# Patient Record
Sex: Male | Born: 1985 | Race: White | Hispanic: No | Marital: Single | Smoking: Never smoker
Health system: Southern US, Community
[De-identification: ages and names within clinical notes are randomized; demographics above are authoritative.]

## PROBLEM LIST (undated history)

## (undated) DIAGNOSIS — N200 Calculus of kidney: Secondary | ICD-10-CM

## (undated) DIAGNOSIS — E669 Obesity, unspecified: Secondary | ICD-10-CM

## (undated) DIAGNOSIS — J45909 Unspecified asthma, uncomplicated: Secondary | ICD-10-CM

## (undated) HISTORY — DX: Obesity, unspecified: E66.9

## (undated) HISTORY — DX: Unspecified asthma, uncomplicated: J45.909

## (undated) HISTORY — PX: LITHOTRIPSY: SUR834

## (undated) HISTORY — DX: Calculus of kidney: N20.0

---

## 2004-10-18 ENCOUNTER — Emergency Department: Payer: Self-pay | Admitting: Unknown Physician Specialty

## 2006-01-21 ENCOUNTER — Emergency Department: Payer: Self-pay | Admitting: Emergency Medicine

## 2006-02-26 ENCOUNTER — Emergency Department: Payer: Self-pay | Admitting: Emergency Medicine

## 2006-09-20 ENCOUNTER — Emergency Department: Payer: Self-pay | Admitting: Emergency Medicine

## 2007-11-16 ENCOUNTER — Emergency Department: Payer: Self-pay | Admitting: Emergency Medicine

## 2007-12-18 ENCOUNTER — Ambulatory Visit: Payer: Self-pay | Admitting: Specialist

## 2008-02-29 ENCOUNTER — Ambulatory Visit: Payer: Self-pay | Admitting: Specialist

## 2008-03-25 ENCOUNTER — Ambulatory Visit: Payer: Self-pay | Admitting: Specialist

## 2008-04-02 ENCOUNTER — Ambulatory Visit: Payer: Self-pay | Admitting: Specialist

## 2008-04-03 ENCOUNTER — Ambulatory Visit: Payer: Self-pay | Admitting: Specialist

## 2008-04-12 ENCOUNTER — Ambulatory Visit: Payer: Self-pay | Admitting: Specialist

## 2008-12-02 ENCOUNTER — Emergency Department: Payer: Self-pay | Admitting: Emergency Medicine

## 2010-05-10 ENCOUNTER — Emergency Department: Payer: Self-pay | Admitting: Emergency Medicine

## 2010-06-26 ENCOUNTER — Emergency Department: Payer: Self-pay | Admitting: Emergency Medicine

## 2011-03-20 ENCOUNTER — Emergency Department: Payer: Self-pay | Admitting: *Deleted

## 2011-03-20 LAB — URINALYSIS, COMPLETE
Bilirubin,UR: NEGATIVE
Blood: NEGATIVE
Ketone: NEGATIVE
Nitrite: NEGATIVE
Ph: 8 (ref 4.5–8.0)
Protein: 30
RBC,UR: 7 /HPF (ref 0–5)
Specific Gravity: 1.025 (ref 1.003–1.030)
Squamous Epithelial: NONE SEEN

## 2012-07-23 IMAGING — CT CT STONE STUDY
1 of 2 series · 15 of 32 positions shown, 19 images · non-contrast
Comparison: None

REASON FOR EXAM: left flank pain (hx kidney stones)
COMMENTS:

PROCEDURE:     CT  - CT ABDOMEN /PELVIS WO (STONE)  - June 26, 2010  [DATE]
RESULT:     Indication: Left flank pain
TECHNIQUE: Multiple axial images from the lung bases to the symphysis pubis
were obtained without oral and without intravenous contrast.

[Series 2: stone · axial · 0.73mm/px · z∈[+186,+634]mm · 15 of 163 slices shown, 19 images]
[im 7/163  soft-tissue]
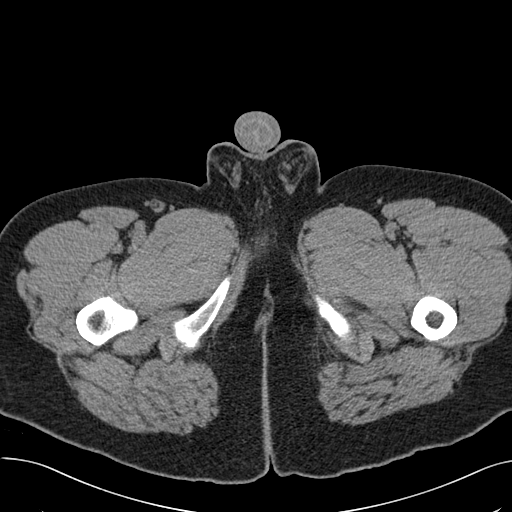
[im 7/163  bone]
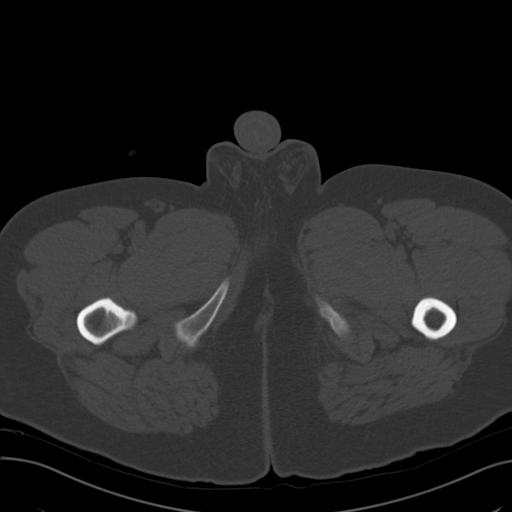
[im 20/163  soft-tissue]
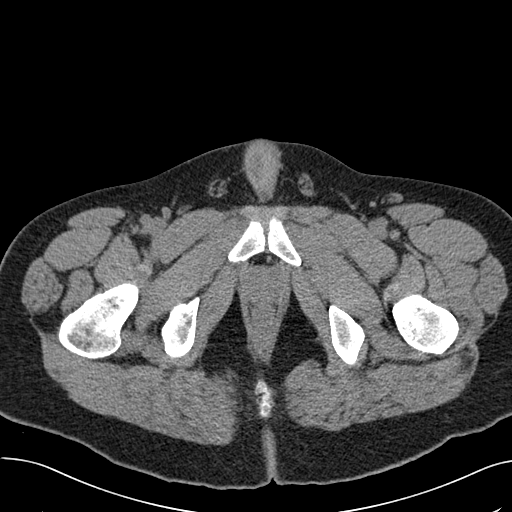
[im 33/163  soft-tissue]
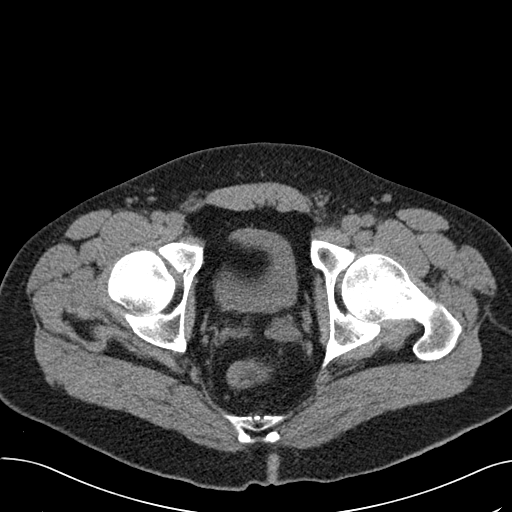
[im 46/163  soft-tissue]
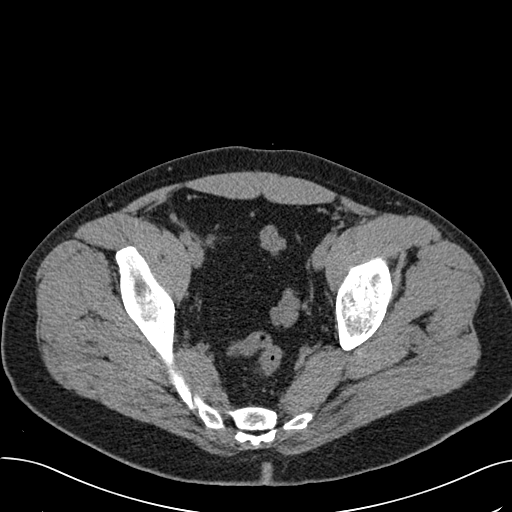
[im 59/163  soft-tissue]
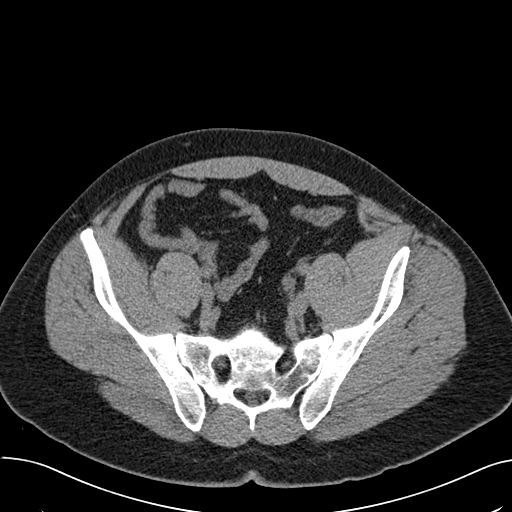
[im 72/163  soft-tissue]
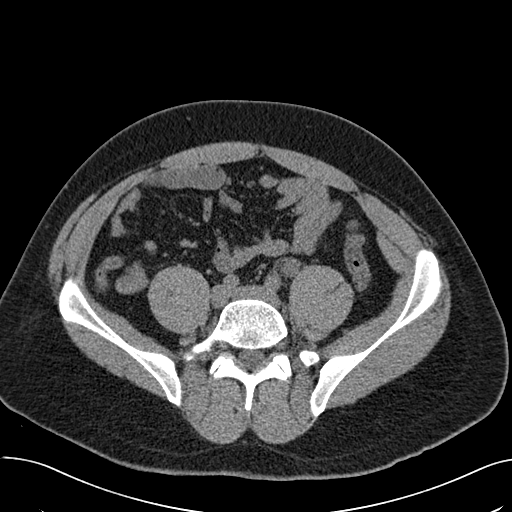
[im 85/163  soft-tissue]
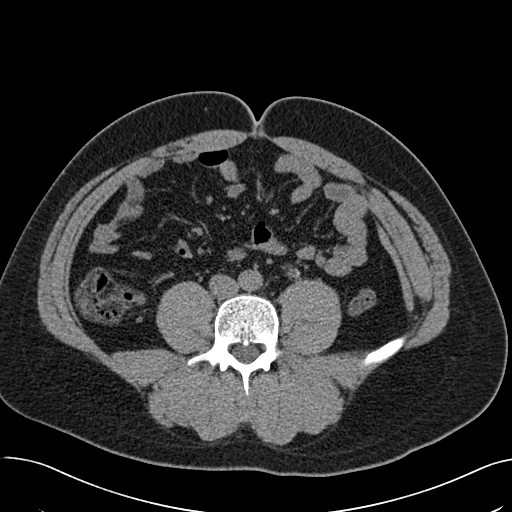
[im 91/163  soft-tissue]
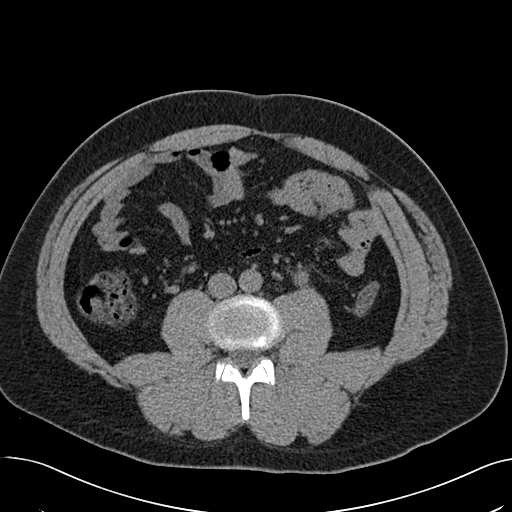
[im 104/163  soft-tissue]
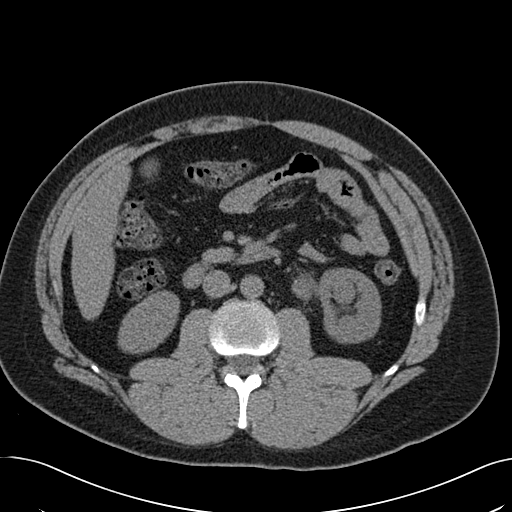
[im 104/163  bone]
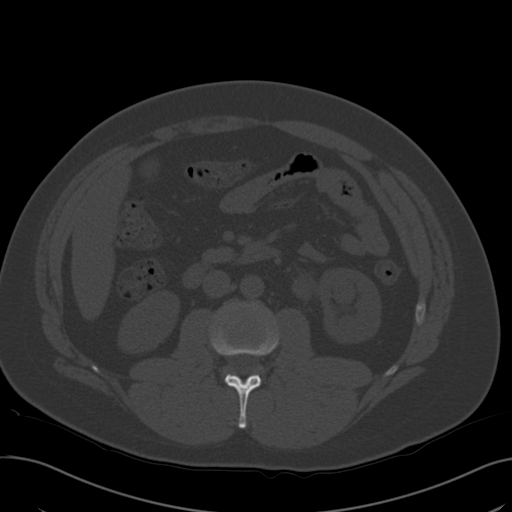
[im 117/163  soft-tissue]
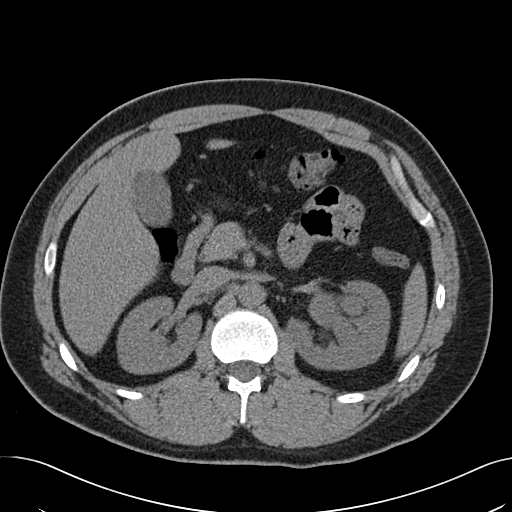
[im 130/163  soft-tissue]
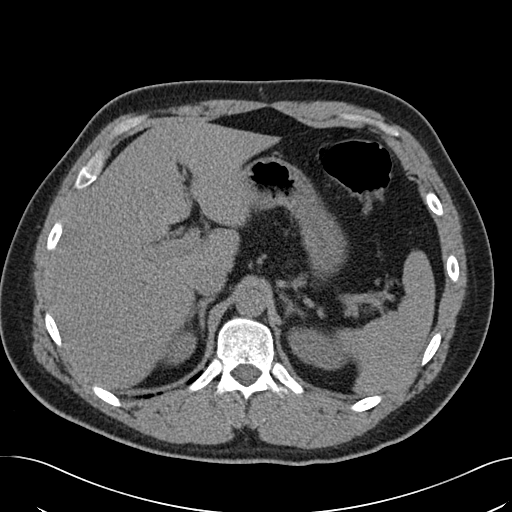
[im 137/163  lung]
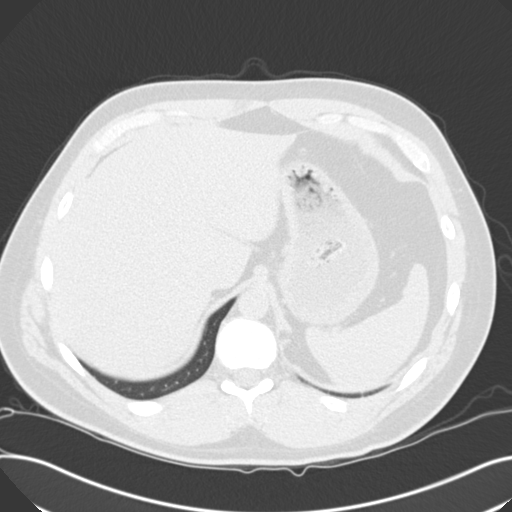
[im 143/163  soft-tissue]
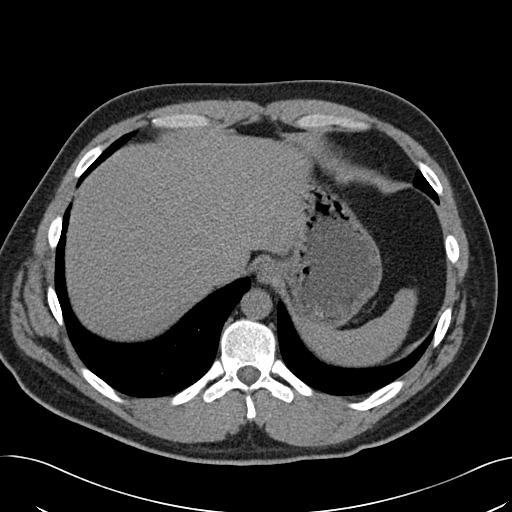
[im 143/163  lung]
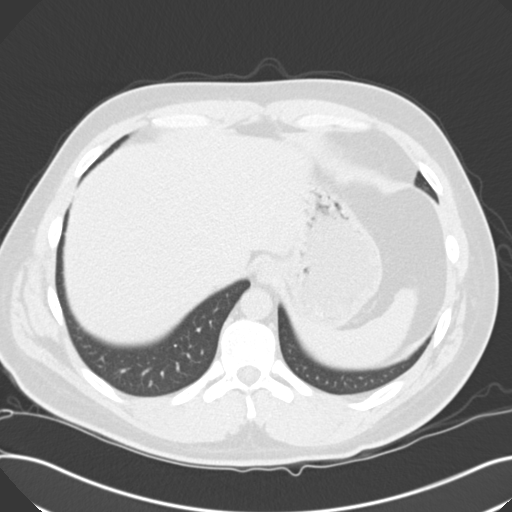
[im 150/163  lung]
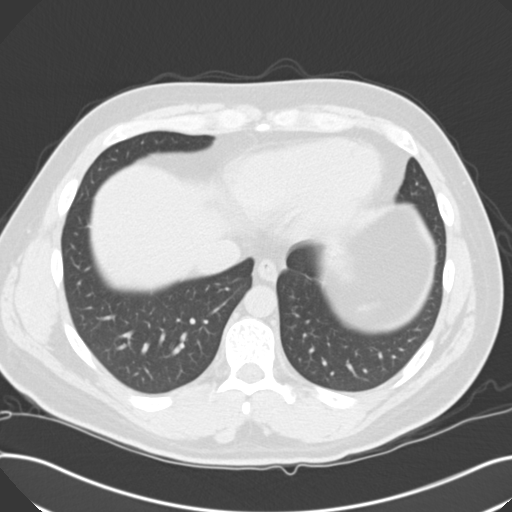
[im 156/163  soft-tissue]
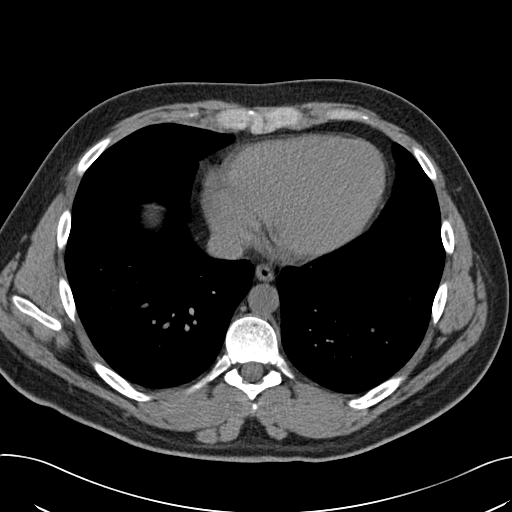
[im 156/163  lung]
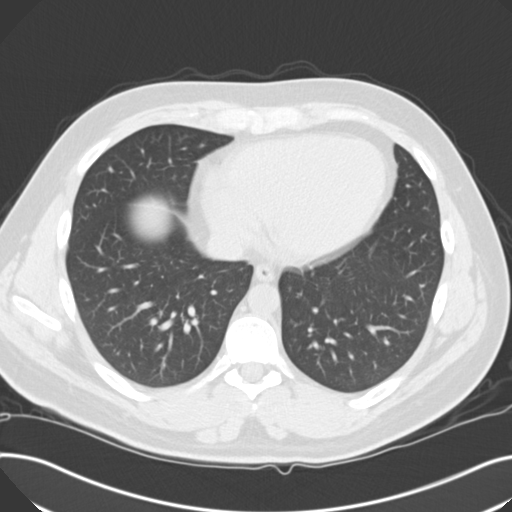

[15 of 32 positions shown; findings below may reference images not displayed]

FINDINGS: The lung bases are clear. There is no pleural or pericardial effusions.

There is a 7 mm distal left ureteral calculus resulting in severe left
hydroureteronephrosis. The kidneys are symmetric in size without evidence
for exophytic mass. The bladder is unremarkable.

The liver demonstrates no focal abnormality. The gallbladder is
unremarkable. The spleen demonstrates no focal abnormality. The adrenal
glands and pancreas are normal.

The unopacified stomach, duodenum, small intestine, and large intestine are
unremarkable, but evaluation is limited by lack of oral contrast. There is a
normal caliber appendix in the right lower quadrant without periappendiceal
inflammatory changes. There is no pneumoperitoneum, pneumatosis, or portal
venous gas. There is no abdominal or pelvic free fluid. There is no
lymphadenopathy.

The abdominal aorta is normal in caliber .

The osseous structures are unremarkable.
IMPRESSION: 1. There is a 7 mm distal left ureteral calculus resulting in severe left
hydroureteronephrosis.

## 2013-04-16 IMAGING — CT CT STONE STUDY
1 of 2 series · 15 of 32 positions shown, 19 images · non-contrast
Comparison: none

REASON FOR EXAM: history of kidney stones, LLQ pain
COMMENTS:   LMP: (Male)

[Series 2: 3mm soft tissue · axial · 0.72mm/px · z∈[-607,-163]mm · 15 of 164 slices shown, 19 images]
[im 8/164  soft-tissue]
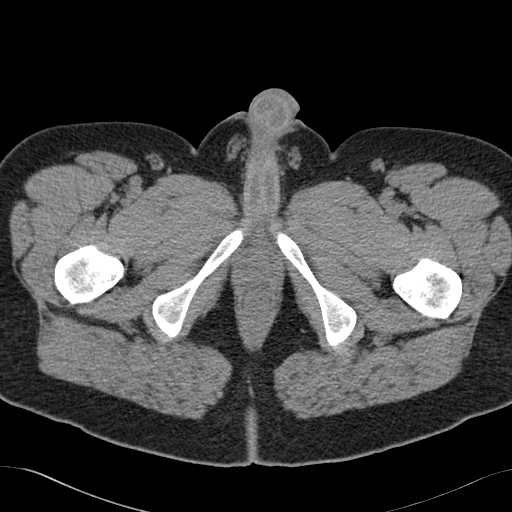
[im 8/164  bone]
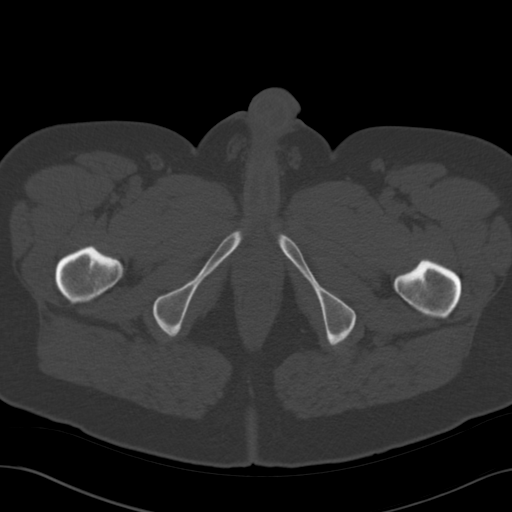
[im 22/164  soft-tissue]
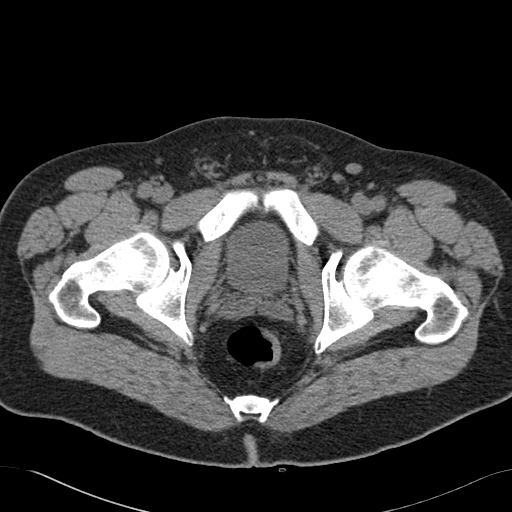
[im 36/164  soft-tissue]
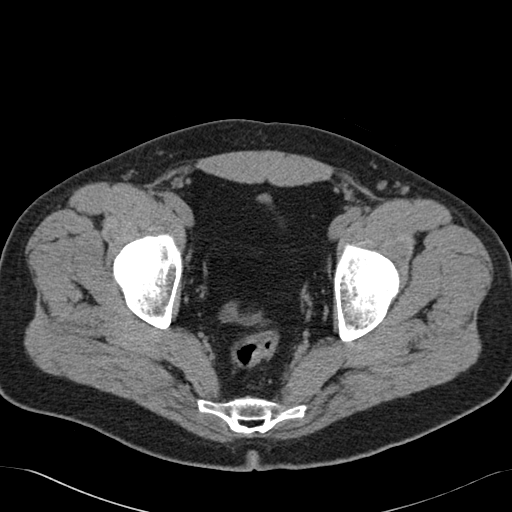
[im 43/164  soft-tissue]
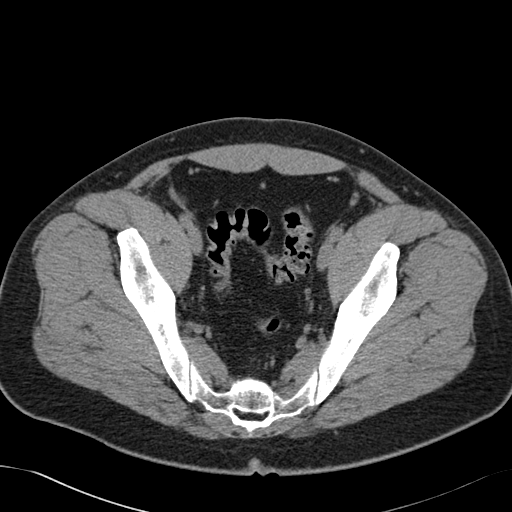
[im 57/164  soft-tissue]
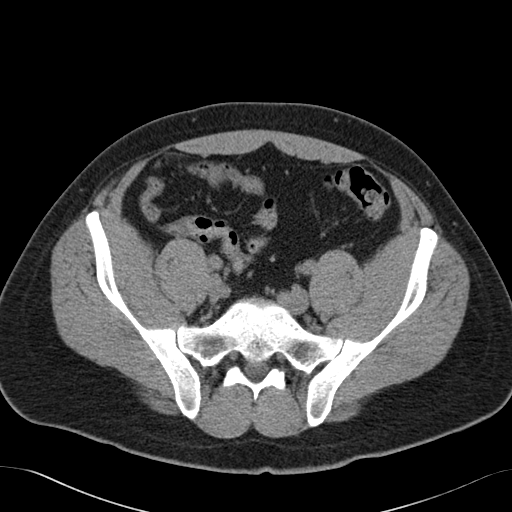
[im 71/164  soft-tissue]
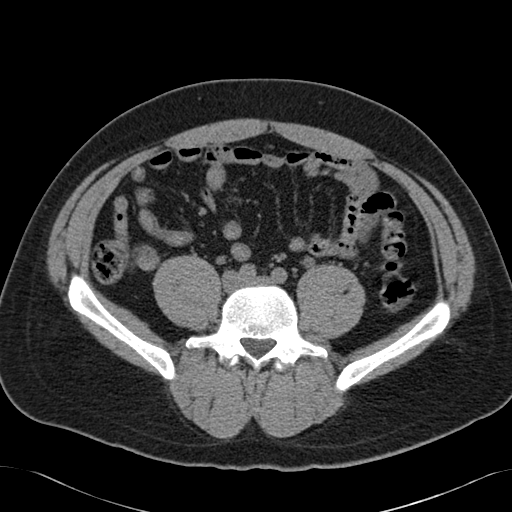
[im 86/164  soft-tissue]
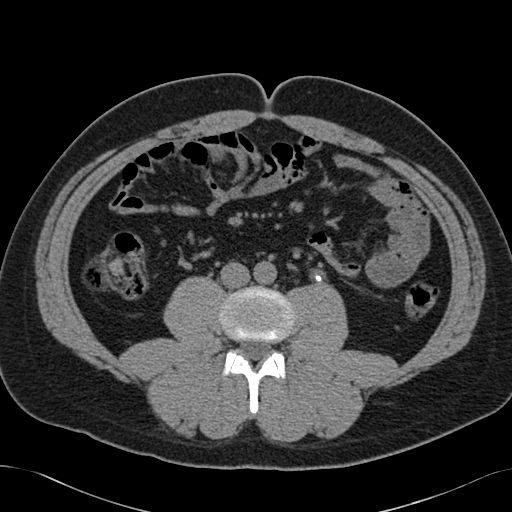
[im 93/164  soft-tissue]
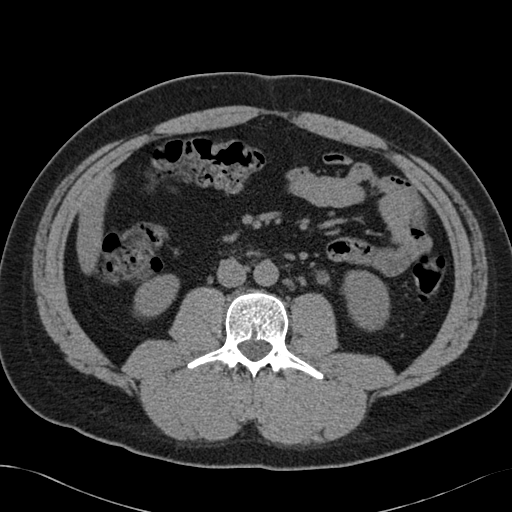
[im 107/164  soft-tissue]
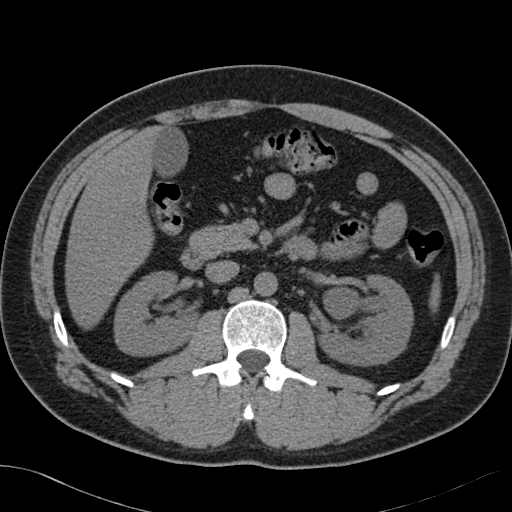
[im 107/164  bone]
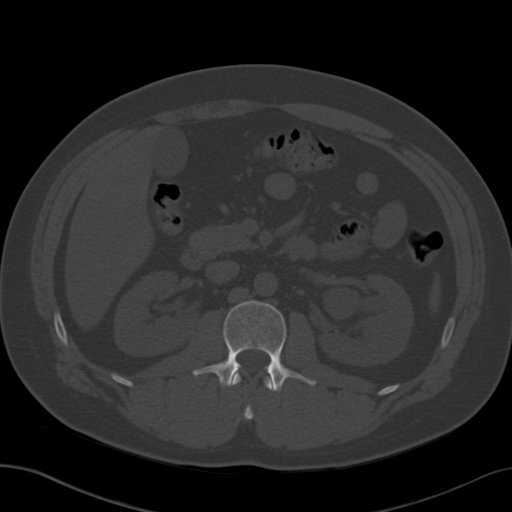
[im 121/164  soft-tissue]
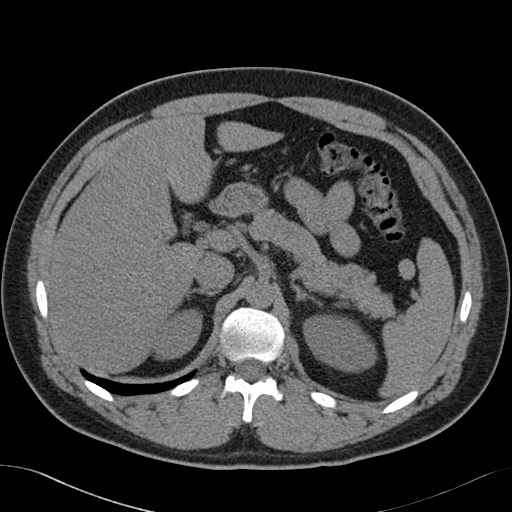
[im 128/164  soft-tissue]
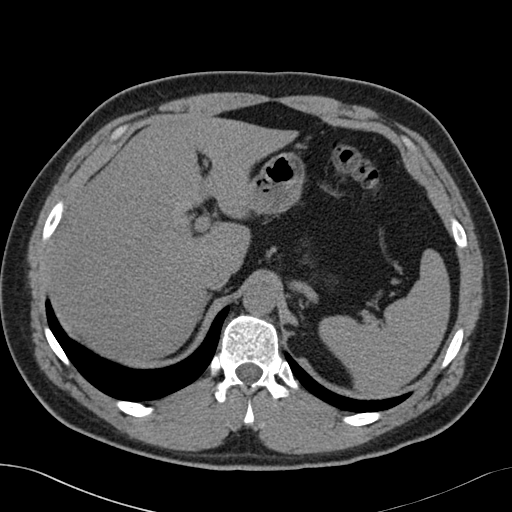
[im 135/164  lung]
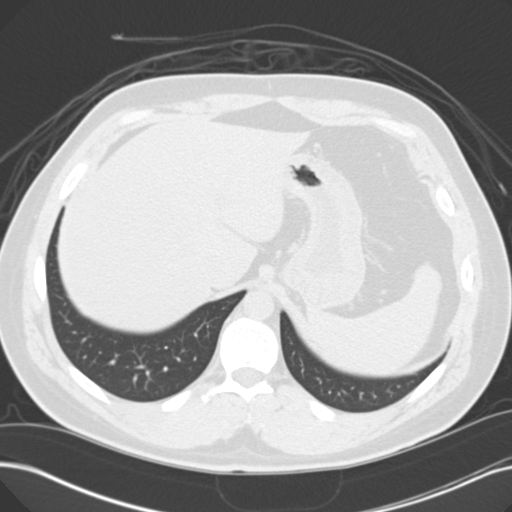
[im 142/164  soft-tissue]
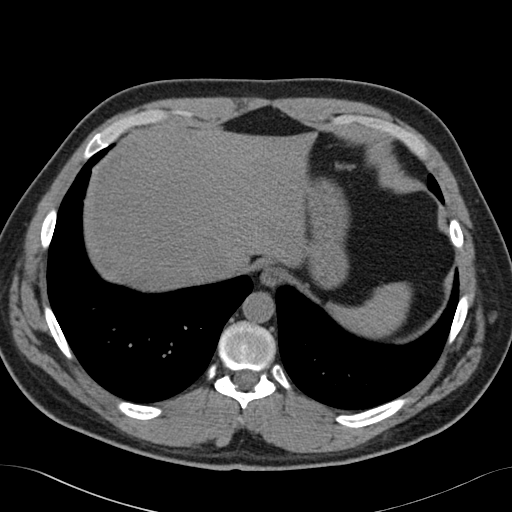
[im 142/164  lung]
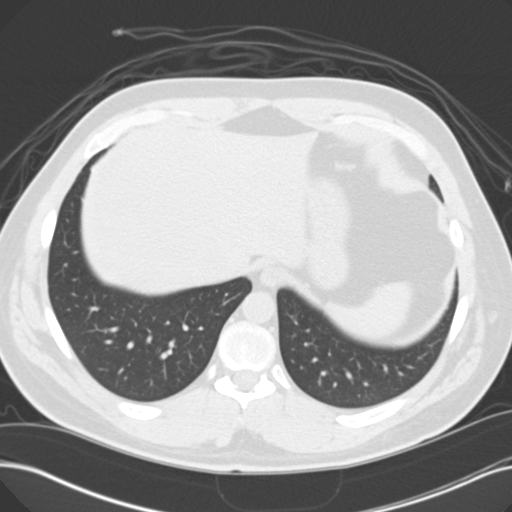
[im 149/164  lung]
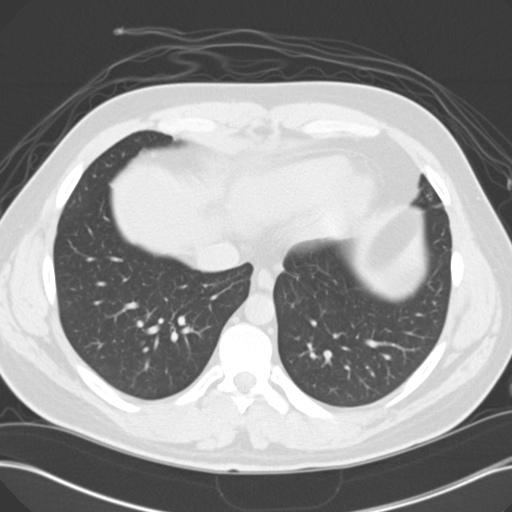
[im 156/164  soft-tissue]
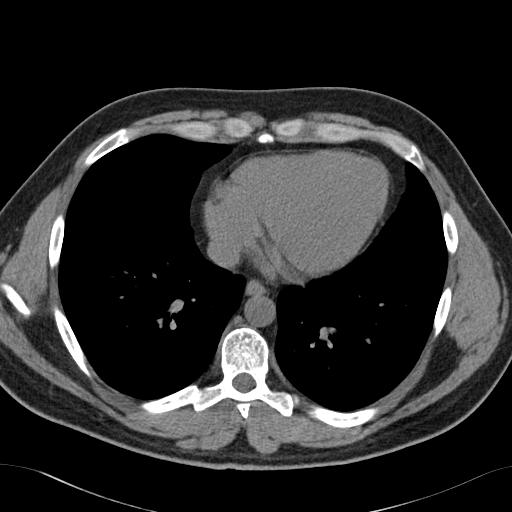
[im 156/164  lung]
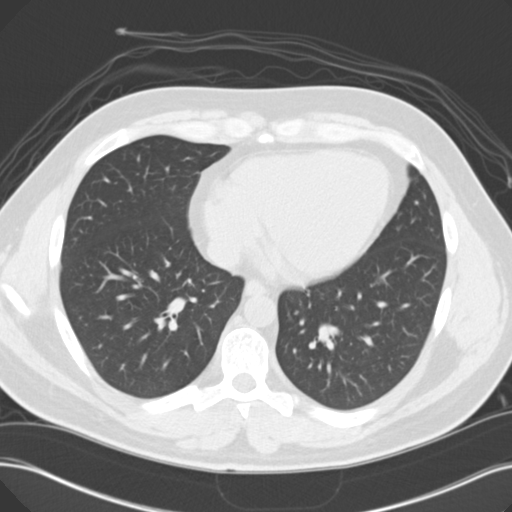

[15 of 32 positions shown; findings below may reference images not displayed]

PROCEDURE:     CT  - CT ABDOMEN /PELVIS WO (STONE)  - March 20, 2011  [DATE]

RESULT:     Renal stone protocol CT of the abdomen and pelvis is
reconstructed at 3.0 mm slice thickness in the axial plane and compared to
previous examination dated 26 June, 2010.

Images through the base of the lungs demonstrate grossly normal aeration.
There is left hydronephrosis with a proximal left renal calculus measuring
4.1 mm on image 79. No additional left renal calculi are evident. No
obstructive change is present on the right. No radiopaque gallstones are
evident. The spleen, pancreas, liver, aorta, the bowel, adrenal glands and
pancreas appear unremarkable. The appendix is normal in appearance.
Scattered sigmoid diverticulosis is present. No urinary bladder stones are
evident.
IMPRESSION: Mild left hydronephrosis with a proximal left ureteral
calculus of 4.1 mm.

## 2015-02-24 ENCOUNTER — Telehealth: Payer: Self-pay | Admitting: Unknown Physician Specialty

## 2015-02-24 NOTE — Telephone Encounter (Signed)
Patient was transferred to me. I spoke with him and he did not mention anything to me about having chest pains. He stated he was having some headaches and male issues so I scheduled him an appointment with [place] tomorrow at 2:30.

## 2015-02-24 NOTE — Telephone Encounter (Signed)
Pt called stating he was having pains in his chest and headaches.  I advised patient he should go to the ER to be evaluated.  He did not want to do this, just wanted a check up and some labs.  I then had him talk to [facility] CMA to further advise pt.

## 2015-02-25 ENCOUNTER — Ambulatory Visit (INDEPENDENT_AMBULATORY_CARE_PROVIDER_SITE_OTHER): Payer: BLUE CROSS/BLUE SHIELD | Admitting: Unknown Physician Specialty

## 2015-02-25 ENCOUNTER — Encounter: Payer: Self-pay | Admitting: Unknown Physician Specialty

## 2015-02-25 VITALS — BP 131/85 | HR 89 | Temp 99.0°F | Ht 69.7 in | Wt 234.6 lb

## 2015-02-25 DIAGNOSIS — E781 Pure hyperglyceridemia: Secondary | ICD-10-CM | POA: Diagnosis not present

## 2015-02-25 DIAGNOSIS — G44229 Chronic tension-type headache, not intractable: Secondary | ICD-10-CM

## 2015-02-25 DIAGNOSIS — N529 Male erectile dysfunction, unspecified: Secondary | ICD-10-CM

## 2015-02-25 DIAGNOSIS — R079 Chest pain, unspecified: Secondary | ICD-10-CM | POA: Diagnosis not present

## 2015-02-25 DIAGNOSIS — E669 Obesity, unspecified: Secondary | ICD-10-CM

## 2015-02-25 MED ORDER — SILDENAFIL CITRATE 100 MG PO TABS: 100.0000 mg | ORAL_TABLET | Freq: Every day | ORAL | Status: DC | PRN

## 2015-02-25 NOTE — Progress Notes (Signed)
f  BP 131/85 mmHg  Pulse 89  Temp(Src) 99 F (37.2 C)  Ht 5' 9.7" (1.77 m)  Wt 234 lb 9.6 oz (106.414 kg)  BMI 33.97 kg/m2  SpO2 99%   Subjective:    Patient ID: Edward Wong, male    DOB: May 01, 1985, 30 y.o.   MRN: [id number]  HPI: Edward Wong is a 30 y.o. male  Chief Complaint  Patient presents with  . Headache    pt states he has been having headaches for a few months now  . Erectile Dysfunction    pt states he has been having trouble getting an erection for a few months    Headaches 2 months ago started getting frontal headaches.  They improve after tylenol and laying down.  They start in the afternoon when he is home with the kids.  No neck pain.  Denies stress.    Chest pain Episodic chest pain that comes and goes.  Chest pain doesn't seem to be aggravated or relieved by anything in particular.  He says it comes on even sitting still.  Complained of this last year with normal EKG.  Pt denies CAD in the family.  He doesn't know his father's history.   ED  Trouble getting and maintaining an erection for 2 months  Reviewed past notes.  Remarkable for high Triglycerides.    Pt drinks ETOH rarely.    Relevant past medical, surgical, family and social history reviewed and updated as indicated. Interim medical history since our last visit reviewed. Allergies and medications reviewed and updated.  Review of Systems  Per HPI unless specifically indicated above     Objective:    BP 131/85 mmHg  Pulse 89  Temp(Src) 99 F (37.2 C)  Ht 5' 9.7" (1.77 m)  Wt 234 lb 9.6 oz (106.414 kg)  BMI 33.97 kg/m2  SpO2 99%  Wt Readings from Last 3 Encounters:  02/25/15 234 lb 9.6 oz (106.414 kg)  05/28/14 242 lb (109.77 kg)    Physical Exam  Constitutional: He is oriented to person, place, and time. He appears well-developed and well-nourished. No distress.  HENT:  Head: Normocephalic and atraumatic.  Eyes: Conjunctivae and lids are normal. Right eye exhibits no  discharge. Left eye exhibits no discharge. No scleral icterus.  Neck: Normal range of motion. Neck supple. No JVD present. Carotid bruit is not present.  Cardiovascular: Normal rate, regular rhythm and normal heart sounds.   Pulmonary/Chest: Effort normal and breath sounds normal. No respiratory distress.  Abdominal: Soft. Normal appearance and bowel sounds are normal. There is no splenomegaly or hepatomegaly. There is no tenderness.  Musculoskeletal: Normal range of motion.  Neurological: He is alert and oriented to person, place, and time.  Skin: Skin is warm, dry and intact. No rash noted. No pallor.  Psychiatric: He has a normal mood and affect. His behavior is normal. Judgment and thought content normal.    Results for orders placed or performed in visit on 03/20/11  Urinalysis, Complete  Result Value Ref Range   Color - urine Yellow    Clarity - urine Turbid    Glucose,UR Negative 0-75 mg/dL   Bilirubin,UR Negative NEGATIVE   Ketone Negative NEGATIVE   Specific Gravity 1.025 1.003-1.030   Blood Negative NEGATIVE   Ph 8.0 4.5-8.0   Protein 30 mg/dL NEGATIVE   Nitrite Negative NEGATIVE   Leukocyte Esterase Negative NEGATIVE   RBC,UR 7 /HPF 0-5 /HPF   WBC UR 3 /HPF 0-5 /HPF  Bacteria TRACE NONE SEEN   Squamous Epithelial NONE SEEN    Mucous PRESENT    Amorphous Crystal PRESENT       Assessment & Plan:   Problem List Items Addressed This Visit      Unprioritized   Hypertriglyceridemia   Relevant Medications   sildenafil (VIAGRA) 100 MG tablet   Other Relevant Orders   Lipid Panel w/o Chol/HDL Ratio    Other Visit Diagnoses    Erectile dysfunction, unspecified erectile dysfunction type    -  Primary    Relevant Orders    Testosterone, Free, Total, SHBG    Chronic tension-type headache, not intractable        Relevant Orders    Comprehensive metabolic panel    TSH    VITAMIN D 25 Hydroxy (Vit-D Deficiency, Fractures)    Chest pain, unspecified chest pain type         I am concerned with chest pain, hypertriglyceremia, and ED.  Refer to cardiology for further evaluation    Relevant Orders    Ambulatory referral to Cardiology       RTC fasting for Lipid, Testosterone, CMP, TSH, and Vitamin D.    Follow up plan: Return in about 3 months (around 05/26/2015).

## 2015-02-26 ENCOUNTER — Other Ambulatory Visit: Payer: BLUE CROSS/BLUE SHIELD

## 2015-02-26 DIAGNOSIS — E781 Pure hyperglyceridemia: Secondary | ICD-10-CM

## 2015-02-26 DIAGNOSIS — N529 Male erectile dysfunction, unspecified: Secondary | ICD-10-CM

## 2015-02-26 DIAGNOSIS — G44229 Chronic tension-type headache, not intractable: Secondary | ICD-10-CM

## 2015-02-27 ENCOUNTER — Encounter: Payer: Self-pay | Admitting: Unknown Physician Specialty

## 2015-02-27 LAB — COMPREHENSIVE METABOLIC PANEL
A/G RATIO: 1.7 (ref 1.1–2.5)
ALK PHOS: 78 IU/L (ref 39–117)
ALT: 49 IU/L — ABNORMAL HIGH (ref 0–44)
AST: 32 IU/L (ref 0–40)
Albumin: 4.5 g/dL (ref 3.5–5.5)
BILIRUBIN TOTAL: 1 mg/dL (ref 0.0–1.2)
BUN/Creatinine Ratio: 14 (ref 8–19)
BUN: 16 mg/dL (ref 6–20)
CALCIUM: 9.7 mg/dL (ref 8.7–10.2)
CHLORIDE: 99 mmol/L (ref 96–106)
CO2: 23 mmol/L (ref 18–29)
Creatinine, Ser: 1.18 mg/dL (ref 0.76–1.27)
GFR calc Af Amer: 94 mL/min/{1.73_m2} (ref >59)
GFR calc non Af Amer: 82 mL/min/{1.73_m2} (ref >59)
GLOBULIN, TOTAL: 2.7 g/dL (ref 1.5–4.5)
Glucose: 99 mg/dL (ref 65–99)
Potassium: 4.1 mmol/L (ref 3.5–5.2)
SODIUM: 139 mmol/L (ref 134–144)
Total Protein: 7.2 g/dL (ref 6.0–8.5)

## 2015-02-27 LAB — TESTOSTERONE, FREE, TOTAL, SHBG
SEX HORMONE BINDING: 22.3 nmol/L (ref 16.5–55.9)
TESTOSTERONE: 283 ng/dL — AB (ref 348–1197)
Testosterone, Free: 8.3 pg/mL — ABNORMAL LOW (ref 8.7–25.1)

## 2015-02-27 LAB — TSH: TSH: 1.26 u[IU]/mL (ref 0.450–4.500)

## 2015-02-27 LAB — LIPID PANEL W/O CHOL/HDL RATIO
CHOLESTEROL TOTAL: 186 mg/dL (ref 100–199)
HDL: 37 mg/dL — AB (ref >39)
LDL Calculated: 115 mg/dL — ABNORMAL HIGH (ref 0–99)
TRIGLYCERIDES: 168 mg/dL — AB (ref 0–149)
VLDL Cholesterol Cal: 34 mg/dL (ref 5–40)

## 2015-02-27 LAB — VITAMIN D 25 HYDROXY (VIT D DEFICIENCY, FRACTURES): Vit D, 25-Hydroxy: 25.8 ng/mL — ABNORMAL LOW (ref 30.0–100.0)

## 2015-05-30 ENCOUNTER — Ambulatory Visit (INDEPENDENT_AMBULATORY_CARE_PROVIDER_SITE_OTHER): Payer: BLUE CROSS/BLUE SHIELD | Admitting: Unknown Physician Specialty

## 2015-05-30 ENCOUNTER — Encounter: Payer: Self-pay | Admitting: Unknown Physician Specialty

## 2015-05-30 VITALS — BP 131/87 | HR 77 | Temp 98.4°F | Ht 70.3 in | Wt 238.8 lb

## 2015-05-30 DIAGNOSIS — E8881 Metabolic syndrome: Secondary | ICD-10-CM | POA: Diagnosis not present

## 2015-05-30 DIAGNOSIS — Z0000 Encounter for general adult medical examination without abnormal findings: Secondary | ICD-10-CM | POA: Diagnosis not present

## 2015-05-30 NOTE — Patient Instructions (Signed)
Diet for Metabolic Syndrome Metabolic syndrome is a disorder that includes at least three of these conditions:  Abdominal obesity.  Too much sugar in your blood.  High blood pressure.  Higher than normal amount of fat (lipids) in your blood.  Lower than normal level of "good" cholesterol (HDL). Following a healthy diet can help to keep metabolic syndrome under control. It can also help to prevent the development of conditions that are associated with metabolic syndrome, such as diabetes, heart disease, and stroke. Along with exercise, a healthy diet:  Helps to improve the way that the body uses insulin.  Promotes weight loss. A common goal for people with this condition is to lose at least 7 to 10 percent of their starting weight. WHAT DO I NEED TO KNOW ABOUT THIS DIET?  Use the glycemic index (GI) to plan your meals. The index tells you how quickly a food will raise your blood sugar. Choose foods that have low GI values. These foods take a longer time to raise blood sugar.  Keep track of how many calories you take in. Eating the right amount of calories will help your achieve a healthy weight.  You may want to follow a Mediterranean diet. This diet includes lots of vegetables, lean meats or fish, whole grains, fruits, and healthy oils and fats. WHAT FOODS CAN I EAT? Grains Stone-ground whole wheat. Pumpernickel bread. Whole-grain bread, crackers, tortillas, cereal, and pasta. Unsweetened oatmeal.Bulgur.Barley.Quinoa.Brown rice or wild rice. Vegetables Lettuce. Spinach. Peas. Beets. Cauliflower. Cabbage. Broccoli. Carrots. Tomatoes. Squash. Eggplant. Herbs. Peppers. Onions. Cucumbers. Brussels sprouts. Sweet potatoes. Yams. Beans. Lentils. Fruits Berries. Apples. Oranges. Grapes. Mango. Pomegranate. Kiwi. Cherries. Meats and Other Protein Sources Seafood and shellfish. Lean meats.Poultry. Tofu. Dairy Low-fat or fat-free dairy products, such as milk, yogurt, and  cheese. Beverages Water. Low-fat milk. Milk alternatives, like soy milk or almond milk. Real fruit juice. Condiments Low-sugar or sugar-free ketchup, barbecue sauce, and mayonnaise. Mustard. Relish. Fats and Oils Avocado. Canola or olive oil. Nuts and nut butters.Seeds. The items listed above may not be a complete list of recommended foods or beverages. Contact your dietitian for more options.  WHAT FOODS ARE NOT RECOMMENDED? Red meat. Palm oil and coconut oil. Processed foods. Fried foods. Alcohol. Sweetened drinks, such as iced tea and soda. Sweets. Salty foods. The items listed above may not be a complete list of foods and beverages to avoid. Contact your dietitian for more information.   This information is not intended to replace advice given to you by your health care provider. Make sure you discuss any questions you have with your health care provider.   Document Released: 06/04/2014 Document Reviewed: 06/04/2014 Elsevier Interactive Patient Education 2016 [employer]. Metabolic Syndrome Metabolic syndrome is the presence of at least three factors that increase your risk of getting cardiovascular disease and diabetes. These factors are:  High blood sugar.  High blood triglyceride level.  High blood pressure.  Low levels of good blood cholesterol (high-density lipoprotein or HDL).  Excess weight around the waist. This factor is present with a waist measurement of:  More than 40 inches in men.  More than 35 inches in women. Metabolic syndrome is sometimes called insulin resistance syndrome and syndrome X. CAUSES The exact cause is not known, but genetics and lifestyle choices play a role. RISK FACTORS You are more likely to develop metabolic syndrome if:  You eat a diet high in calories and saturated fat.  You do not exercise regularly.  You are overweight.  You have a family history of metabolic syndrome.  You are Asian.  You are older in age.  You have  insulin resistance.  You use any tobacco products, including cigarettes, chewing tobacco, or electronic cigarettes. SIGNS AND SYMPTOMS Metabolic syndrome has no specific symptoms. DIAGNOSIS To make a diagnosis, your health care provider will determine whether you have at least three of the factors that make up metabolic syndrome by:  Taking your blood pressure.  Measuring your waist.  Ordering blood tests. TREATMENT Treatment may include:  Lifestyle changes to reduce your risk for heart disease and stroke, such as:  Exercise.  Weight loss.  Maintaining a healthy diet.  Quitting the use of any tobacco products, including cigarettes, chewing tobacco, or electronic cigarettes.  Medicines that:  Help your body to maintain glucose control.  Reduce your blood pressure and your blood triglyceride levels. HOME CARE INSTRUCTIONS  Exercise regularly.  Maintain a healthy diet.  Do not use any tobacco products, including cigarettes, chewing tobacco, or electronic cigarettes. If you need help quitting, ask your health care provider.  Keep all follow-up visits as directed by your health care provider. This is important.  Measure your waist regularly and record the measurement. To measure your waist:  Stand up straight.  Breathe out.  Wrap the measuring tape around the part of your waist that is just above your hipbones.  Read the measurement. SEEK MEDICAL CARE IF:  You feel very tired.  You develop excessive thirst.  You pass large quantities of urine.  You put on weight around your waist.  You have headaches over and over again.  You have a dizzy spell. SEEK IMMEDIATE MEDICAL CARE IF:  You develop sudden blurred vision.  You develop a sudden dizzy spell.  You have sudden trouble speaking or swallowing.  You have sudden weakness in your arm or leg.  You have chest pains or trouble breathing.  You feel like your heartbeat is abnormal.  You faint.   This  information is not intended to replace advice given to you by your health care provider. Make sure you discuss any questions you have with your health care provider.   Document Released: 04/27/2007 Document Revised: 02/08/2014 Document Reviewed: 08/24/2013 Elsevier Interactive Patient Education [employer]2016 [employer]vier Inc.

## 2015-05-30 NOTE — Assessment & Plan Note (Addendum)
Discussed diet and exercise.  Information given.   

## 2015-05-30 NOTE — Progress Notes (Signed)
BP 131/87 mmHg  Pulse 77  Temp(Src) 98.4 F (36.9 C)  Ht 5' 10.3" (1.786 m)  Wt 238 lb 12.8 oz (108.319 kg)  BMI 33.96 kg/m2  SpO2 96%   Subjective:    Patient ID: Edward Wong, male    DOB: Jun 28, 1985, 30 y.o.   MRN: [id number]  HPI: Edward Wong is a 30 y.o. male  Chief Complaint  Patient presents with  . Annual Exam   Social History   Social History  . Marital Status: Single    Spouse Name: N/A  . Number of Children: N/A  . Years of Education: N/A   Occupational History  . Not on file.   Social History Main Topics  . Smoking status: Never Smoker   . Smokeless tobacco: Never Used  . Alcohol Use: 0.0 oz/week    0 Standard drinks or equivalent per week     Comment: on occasion- twice a year  . Drug Use: No  . Sexual Activity: Yes   Other Topics Concern  . Not on file   Social History Narrative   Past Surgical History  Procedure Laterality Date  . Lithotripsy      x2   Family History  Problem Relation Age of Onset  . Asthma Mother   . Asthma Sister   . Stroke Maternal Grandmother   . Asthma Maternal Grandmother   . Asthma Son   . Cancer Paternal Grandmother   . Asthma Son   . Asthma Son     Depression screen [facility] 2/9 05/30/2015  Decreased Interest 0  Down, Depressed, Hopeless 0  PHQ - 2 Score 0      Relevant past medical, surgical, family and social history reviewed and updated as indicated. Interim medical history since our last visit reviewed. Allergies and medications reviewed and updated.  Review of Systems  Constitutional: Negative.   HENT: Negative.   Eyes: Negative.   Respiratory: Negative.   Cardiovascular: Negative.   Gastrointestinal: Negative.   Endocrine: Negative.   Genitourinary: Negative.   Skin: Negative.   Allergic/Immunologic: Negative.   Neurological: Negative.   Hematological: Negative.   Psychiatric/Behavioral: Negative.     Per HPI unless specifically indicated above     Objective:    BP 131/87 mmHg   Pulse 77  Temp(Src) 98.4 F (36.9 C)  Ht 5' 10.3" (1.786 m)  Wt 238 lb 12.8 oz (108.319 kg)  BMI 33.96 kg/m2  SpO2 96%  Wt Readings from Last 3 Encounters:  05/30/15 238 lb 12.8 oz (108.319 kg)  02/25/15 234 lb 9.6 oz (106.414 kg)  05/28/14 242 lb (109.77 kg)    Physical Exam  Constitutional: He is oriented to person, place, and time. He appears well-developed and well-nourished.  HENT:  Head: Normocephalic.  Right Ear: Tympanic membrane, external ear and ear canal normal.  Left Ear: Tympanic membrane, external ear and ear canal normal.  Mouth/Throat: Uvula is midline, oropharynx is clear and moist and mucous membranes are normal.  Eyes: Pupils are equal, round, and reactive to light.  Cardiovascular: Normal rate, regular rhythm and normal heart sounds.  Exam reveals no gallop and no friction rub.   No murmur heard. Pulmonary/Chest: Effort normal and breath sounds normal. No respiratory distress.  Abdominal: Soft. Bowel sounds are normal. He exhibits no distension. There is no tenderness.  Musculoskeletal: Normal range of motion.  Neurological: He is alert and oriented to person, place, and time. He has normal reflexes.  Skin: Skin is warm and dry.  Psychiatric: He has a normal mood and affect. His behavior is normal. Judgment and thought content normal.    Results for orders placed or performed in visit on 02/26/15  Lipid Panel w/o Chol/HDL Ratio  Result Value Ref Range   Cholesterol, Total 186 100 - 199 mg/dL   Triglycerides 161168 (H) 0 - 149 mg/dL   HDL 37 (L) >09>39 mg/dL   VLDL Cholesterol Cal 34 5 - 40 mg/dL   LDL Calculated 604115 (H) 0 - 99 mg/dL  Testosterone, Free, Total, SHBG  Result Value Ref Range   Testosterone 283 (L) 348 - 1197 ng/dL   Comment, Testosterone Comment    Testosterone, Free 8.3 (L) 8.7 - 25.1 pg/mL   Sex Hormone Binding 22.3 16.5 - 55.9 nmol/L  Comprehensive metabolic panel  Result Value Ref Range   Glucose 99 65 - 99 mg/dL   BUN 16 6 - 20 mg/dL    Creatinine, Ser 5.401.18 0.76 - 1.27 mg/dL   GFR calc non Af Amer 82 >59 mL/min/1.73   GFR calc Af Amer 94 >59 mL/min/1.73   BUN/Creatinine Ratio 14 8 - 19   Sodium 139 134 - 144 mmol/L   Potassium 4.1 3.5 - 5.2 mmol/L   Chloride 99 96 - 106 mmol/L   CO2 23 18 - 29 mmol/L   Calcium 9.7 8.7 - 10.2 mg/dL   Total Protein 7.2 6.0 - 8.5 g/dL   Albumin 4.5 3.5 - 5.5 g/dL   Globulin, Total 2.7 1.5 - 4.5 g/dL   Albumin/Globulin Ratio 1.7 1.1 - 2.5   Bilirubin Total 1.0 0.0 - 1.2 mg/dL   Alkaline Phosphatase 78 39 - 117 IU/L   AST 32 0 - 40 IU/L   ALT 49 (H) 0 - 44 IU/L  TSH  Result Value Ref Range   TSH 1.260 0.450 - 4.500 uIU/mL  VITAMIN D 25 Hydroxy (Vit-D Deficiency, Fractures)  Result Value Ref Range   Vit D, 25-Hydroxy 25.8 (L) 30.0 - 100.0 ng/mL      Assessment & Plan:   Problem List Items Addressed This Visit      Unprioritized   Metabolic syndrome    Discussed diet and exercise.  Information given       Other Visit Diagnoses    Annual physical exam    -  Primary        Follow up plan: Return in about 1 year (around 05/29/2016).

## 2016-05-31 ENCOUNTER — Ambulatory Visit (INDEPENDENT_AMBULATORY_CARE_PROVIDER_SITE_OTHER): Payer: BLUE CROSS/BLUE SHIELD | Admitting: Unknown Physician Specialty

## 2016-05-31 ENCOUNTER — Encounter: Payer: Self-pay | Admitting: Unknown Physician Specialty

## 2016-05-31 VITALS — BP 142/87 | HR 69 | Temp 98.3°F | Ht 70.0 in | Wt 242.1 lb

## 2016-05-31 DIAGNOSIS — Z0000 Encounter for general adult medical examination without abnormal findings: Secondary | ICD-10-CM

## 2016-05-31 DIAGNOSIS — E6609 Other obesity due to excess calories: Secondary | ICD-10-CM | POA: Diagnosis not present

## 2016-05-31 DIAGNOSIS — N529 Male erectile dysfunction, unspecified: Secondary | ICD-10-CM

## 2016-05-31 DIAGNOSIS — I10 Essential (primary) hypertension: Secondary | ICD-10-CM

## 2016-05-31 DIAGNOSIS — E8881 Metabolic syndrome: Secondary | ICD-10-CM | POA: Diagnosis not present

## 2016-05-31 LAB — BAYER DCA HB A1C WAIVED: HB A1C (BAYER DCA - WAIVED): 5 % (ref <7.0)

## 2016-05-31 NOTE — Assessment & Plan Note (Signed)
Discussed diet and exercise 

## 2016-05-31 NOTE — Assessment & Plan Note (Signed)
Elevated today.  This is the first time.  DASH diet and recheck in 6 months

## 2016-05-31 NOTE — Assessment & Plan Note (Signed)
Check Hgb A1C 

## 2016-05-31 NOTE — Assessment & Plan Note (Signed)
Taking Sildenafil and satisfied with results

## 2016-05-31 NOTE — Patient Instructions (Addendum)
DASH Eating Plan DASH stands for "Dietary Approaches to Stop Hypertension." The DASH eating plan is a healthy eating plan that has been shown to reduce high blood pressure (hypertension). It may also reduce your risk for type 2 diabetes, heart disease, and stroke. The DASH eating plan may also help with weight loss. What are tips for following this plan? General guidelines  Avoid eating more than 2,300 mg (milligrams) of salt (sodium) a day. If you have hypertension, you may need to reduce your sodium intake to 1,500 mg a day.  Limit alcohol intake to no more than 1 drink a day for nonpregnant women and 2 drinks a day for men. One drink equals 12 oz of beer, 5 oz of wine, or 1 oz of hard liquor.  Work with your health care provider to maintain a healthy body weight or to lose weight. Ask what an ideal weight is for you.  Get at least 30 minutes of exercise that causes your heart to beat faster (aerobic exercise) most days of the week. Activities may include walking, swimming, or biking.  Work with your health care provider or diet and nutrition specialist (dietitian) to adjust your eating plan to your individual calorie needs. Reading food labels  Check food labels for the amount of sodium per serving. Choose foods with less than 5 percent of the Daily Value of sodium. Generally, foods with less than 300 mg of sodium per serving fit into this eating plan.  To find whole grains, look for the word "whole" as the first word in the ingredient list. Shopping  Buy products labeled as "low-sodium" or "no salt added."  Buy fresh foods. Avoid canned foods and premade or frozen meals. Cooking  Avoid adding salt when cooking. Use salt-free seasonings or herbs instead of table salt or sea salt. Check with your health care provider or pharmacist before using salt substitutes.  Do not fry foods. Cook foods using healthy methods such as baking, boiling, grilling, and broiling instead.  Cook with  heart-healthy oils, such as olive, canola, soybean, or sunflower oil. Meal planning   Eat a balanced diet that includes: ? 5 or more servings of fruits and vegetables each day. At each meal, try to fill half of your plate with fruits and vegetables. ? Up to 6-8 servings of whole grains each day. ? Less than 6 oz of lean meat, poultry, or fish each day. A 3-oz serving of meat is about the same size as a deck of cards. One egg equals 1 oz. ? 2 servings of low-fat dairy each day. ? A serving of nuts, seeds, or beans 5 times each week. ? Heart-healthy fats. Healthy fats called Omega-3 fatty acids are found in foods such as flaxseeds and coldwater fish, like sardines, salmon, and mackerel.  Limit how much you eat of the following: ? Canned or prepackaged foods. ? Food that is high in trans fat, such as fried foods. ? Food that is high in saturated fat, such as fatty meat. ? Sweets, desserts, sugary drinks, and other foods with added sugar. ? Full-fat dairy products.  Do not salt foods before eating.  Try to eat at least 2 vegetarian meals each week.  Eat more home-cooked food and less restaurant, buffet, and fast food.  When eating at a restaurant, ask that your food be prepared with less salt or no salt, if possible. What foods are recommended? The items listed may not be a complete list. Talk with your dietitian about what   dietary choices are best for you. Grains Whole-grain or whole-wheat bread. Whole-grain or whole-wheat pasta. Brown rice. Oatmeal. Quinoa. Bulgur. Whole-grain and low-sodium cereals. Pita bread. Low-fat, low-sodium crackers. Whole-wheat flour tortillas. Vegetables Fresh or frozen vegetables (raw, steamed, roasted, or grilled). Low-sodium or reduced-sodium tomato and vegetable juice. Low-sodium or reduced-sodium tomato sauce and tomato paste. Low-sodium or reduced-sodium canned vegetables. Fruits All fresh, dried, or frozen fruit. Canned fruit in natural juice (without  added sugar). Meat and other protein foods Skinless chicken or turkey. Ground chicken or turkey. Pork with fat trimmed off. Fish and seafood. Egg whites. Dried beans, peas, or lentils. Unsalted nuts, nut butters, and seeds. Unsalted canned beans. Lean cuts of beef with fat trimmed off. Low-sodium, lean deli meat. Dairy Low-fat (1%) or fat-free (skim) milk. Fat-free, low-fat, or reduced-fat cheeses. Nonfat, low-sodium ricotta or cottage cheese. Low-fat or nonfat yogurt. Low-fat, low-sodium cheese. Fats and oils Soft margarine without trans fats. Vegetable oil. Low-fat, reduced-fat, or light mayonnaise and salad dressings (reduced-sodium). Canola, safflower, olive, soybean, and sunflower oils. Avocado. Seasoning and other foods Herbs. Spices. Seasoning mixes without salt. Unsalted popcorn and pretzels. Fat-free sweets. What foods are not recommended? The items listed may not be a complete list. Talk with your dietitian about what dietary choices are best for you. Grains Baked goods made with fat, such as croissants, muffins, or some breads. Dry pasta or rice meal packs. Vegetables Creamed or fried vegetables. Vegetables in a cheese sauce. Regular canned vegetables (not low-sodium or reduced-sodium). Regular canned tomato sauce and paste (not low-sodium or reduced-sodium). Regular tomato and vegetable juice (not low-sodium or reduced-sodium). Pickles. Olives. Fruits Canned fruit in a light or heavy syrup. Fried fruit. Fruit in cream or butter sauce. Meat and other protein foods Fatty cuts of meat. Ribs. Fried meat. Bacon. Sausage. Bologna and other processed lunch meats. Salami. Fatback. Hotdogs. Bratwurst. Salted nuts and seeds. Canned beans with added salt. Canned or smoked fish. Whole eggs or egg yolks. Chicken or turkey with skin. Dairy Whole or 2% milk, cream, and half-and-half. Whole or full-fat cream cheese. Whole-fat or sweetened yogurt. Full-fat cheese. Nondairy creamers. Whipped toppings.  Processed cheese and cheese spreads. Fats and oils Butter. Stick margarine. Lard. Shortening. Ghee. Bacon fat. Tropical oils, such as coconut, palm kernel, or palm oil. Seasoning and other foods Salted popcorn and pretzels. Onion salt, garlic salt, seasoned salt, table salt, and sea salt. Worcestershire sauce. Tartar sauce. Barbecue sauce. Teriyaki sauce. Soy sauce, including reduced-sodium. Steak sauce. Canned and packaged gravies. Fish sauce. Oyster sauce. Cocktail sauce. Horseradish that you find on the shelf. Ketchup. Mustard. Meat flavorings and tenderizers. Bouillon cubes. Hot sauce and Tabasco sauce. Premade or packaged marinades. Premade or packaged taco seasonings. Relishes. Regular salad dressings. Where to find more information:  National Heart, Lung, and Blood Institute: www.nhlbi.nih.gov  American Heart Association: www.heart.org Summary  The DASH eating plan is a healthy eating plan that has been shown to reduce high blood pressure (hypertension). It may also reduce your risk for type 2 diabetes, heart disease, and stroke.  With the DASH eating plan, you should limit salt (sodium) intake to 2,300 mg a day. If you have hypertension, you may need to reduce your sodium intake to 1,500 mg a day.  When on the DASH eating plan, aim to eat more fresh fruits and vegetables, whole grains, lean proteins, low-fat dairy, and heart-healthy fats.  Work with your health care provider or diet and nutrition specialist (dietitian) to adjust your eating plan to your individual   calorie needs. This information is not intended to replace advice given to you by your health care provider. Make sure you discuss any questions you have with your health care provider. Document Released: 01/07/2011 Document Revised: 01/12/2016 Document Reviewed: 01/12/2016 Elsevier Interactive Patient Education  2017 Elsevier Inc.  

## 2016-05-31 NOTE — Progress Notes (Signed)
BP (!) 142/87 (BP Location: Left Arm, Cuff Size: Large)   Pulse 69   Temp 98.3 F (36.8 C)   Ht  (1.778 m)   Wt 242 lb 1.6 oz (109.8 kg)   SpO2 97%   BMI 34.74 kg/m    Subjective:    Patient ID: Edward Wong, male    DOB: 01-27-1986, 31 y.o.   MRN: [id number]  HPI: Edward Wong is a 31 y.o. male  Chief Complaint  Patient presents with  . Annual Exam   Social History   Social History  . Marital status: Single    Spouse name: N/A  . Number of children: N/A  . Years of education: N/A   Occupational History  . Not on file.   Social History Main Topics  . Smoking status: Never Smoker  . Smokeless tobacco: Never Used  . Alcohol use 0.0 oz/week     Comment: on occasion- twice a year  . Drug use: No  . Sexual activity: Yes   Other Topics Concern  . Not on file   Social History Narrative  . No narrative on file   Family History  Problem Relation Age of Onset  . Asthma Mother   . Asthma Sister   . Stroke Maternal Grandmother   . Asthma Maternal Grandmother   . Asthma Son   . Cancer Paternal Grandmother   . Asthma Son   . Asthma Son    Past Medical History:  Diagnosis Date  . Asthma   . Kidney stones   . Obesity    Past Surgical History:  Procedure Laterality Date  . LITHOTRIPSY     x2    Relevant past medical, surgical, family and social history reviewed and updated as indicated. Interim medical history since our last visit reviewed. Allergies and medications reviewed and updated.  Review of Systems  Constitutional: Negative.   HENT: Negative.   Eyes: Negative.   Respiratory: Negative.   Cardiovascular: Negative.   Gastrointestinal: Negative.   Endocrine: Negative.   Genitourinary:        Medications working well for ED  Skin: Negative.   Allergic/Immunologic: Negative.   Neurological: Negative.   Hematological: Negative.   Psychiatric/Behavioral: Negative.     Per HPI unless specifically indicated above     Objective:      BP (!) 142/87 (BP Location: Left Arm, Cuff Size: Large)   Pulse 69   Temp 98.3 F (36.8 C)   Ht  (1.778 m)   Wt 242 lb 1.6 oz (109.8 kg)   SpO2 97%   BMI 34.74 kg/m   Wt Readings from Last 3 Encounters:  05/31/16 242 lb 1.6 oz (109.8 kg)  05/30/15 238 lb 12.8 oz (108.3 kg)  02/25/15 234 lb 9.6 oz (106.4 kg)    Physical Exam  Constitutional: He is oriented to person, place, and time. He appears well-developed and well-nourished.  HENT:  Head: Normocephalic.  Right Ear: Tympanic membrane, external ear and ear canal normal.  Left Ear: Tympanic membrane, external ear and ear canal normal.  Mouth/Throat: Uvula is midline, oropharynx is clear and moist and mucous membranes are normal.  Eyes: Pupils are equal, round, and reactive to light.  Cardiovascular: Normal rate, regular rhythm and normal heart sounds.  Exam reveals no gallop and no friction rub.   No murmur heard. Pulmonary/Chest: Effort normal and breath sounds normal. No respiratory distress.  Abdominal: Soft. Bowel sounds are normal. He exhibits no distension. There is  no tenderness.  Musculoskeletal: Normal range of motion.  Neurological: He is alert and oriented to person, place, and time. He has normal reflexes.  Skin: Skin is warm and dry.  Psychiatric: He has a normal mood and affect. His behavior is normal. Judgment and thought content normal.      Assessment & Plan:   Problem List Items Addressed This Visit      Unprioritized   ED (erectile dysfunction)    Taking Sildenafil and satisfied with results      Essential hypertension, benign    Elevated today.  This is the first time.  DASH diet and recheck in 6 months      Relevant Medications   sildenafil (REVATIO) 20 MG tablet   Other Relevant Orders   Comprehensive metabolic panel   Lipid Panel w/o Chol/HDL Ratio   Metabolic syndrome    Check Hgb A1C      Relevant Orders   Comprehensive metabolic panel   Lipid Panel w/o Chol/HDL Ratio   Obesity     Discussed diet and exercise       Other Visit Diagnoses    Annual physical exam    -  Primary   Relevant Orders   CBC with Differential/Platelet   Comprehensive metabolic panel   Lipid Panel w/o Chol/HDL Ratio   Bayer DCA Hb A1c Waived   TSH   VITAMIN D 25 Hydroxy (Vit-D Deficiency, Fractures)       Follow up plan: Return in about 6 months (around 11/30/2016).

## 2016-06-01 ENCOUNTER — Encounter: Payer: Self-pay | Admitting: Unknown Physician Specialty

## 2016-06-01 LAB — COMPREHENSIVE METABOLIC PANEL
ALK PHOS: 71 IU/L (ref 39–117)
ALT: 42 IU/L (ref 0–44)
AST: 24 IU/L (ref 0–40)
Albumin/Globulin Ratio: 1.5 (ref 1.2–2.2)
Albumin: 4.4 g/dL (ref 3.5–5.5)
BUN/Creatinine Ratio: 12 (ref 9–20)
BUN: 15 mg/dL (ref 6–20)
Bilirubin Total: 0.7 mg/dL (ref 0.0–1.2)
CHLORIDE: 99 mmol/L (ref 96–106)
CO2: 25 mmol/L (ref 18–29)
CREATININE: 1.24 mg/dL (ref 0.76–1.27)
Calcium: 9.6 mg/dL (ref 8.7–10.2)
GFR calc Af Amer: 88 mL/min/{1.73_m2} (ref >59)
GFR calc non Af Amer: 76 mL/min/{1.73_m2} (ref >59)
GLUCOSE: 100 mg/dL — AB (ref 65–99)
Globulin, Total: 3 g/dL (ref 1.5–4.5)
Potassium: 4.1 mmol/L (ref 3.5–5.2)
Sodium: 139 mmol/L (ref 134–144)
Total Protein: 7.4 g/dL (ref 6.0–8.5)

## 2016-06-01 LAB — CBC WITH DIFFERENTIAL/PLATELET
BASOS ABS: 0 10*3/uL (ref 0.0–0.2)
Basos: 1 %
EOS (ABSOLUTE): 0.3 10*3/uL (ref 0.0–0.4)
Eos: 4 %
HEMOGLOBIN: 15.1 g/dL (ref 13.0–17.7)
Hematocrit: 43.6 % (ref 37.5–51.0)
Immature Grans (Abs): 0 10*3/uL (ref 0.0–0.1)
Immature Granulocytes: 0 %
LYMPHS ABS: 2.4 10*3/uL (ref 0.7–3.1)
Lymphs: 39 %
MCH: 30.3 pg (ref 26.6–33.0)
MCHC: 34.6 g/dL (ref 31.5–35.7)
MCV: 87 fL (ref 79–97)
MONOCYTES: 6 %
MONOS ABS: 0.4 10*3/uL (ref 0.1–0.9)
Neutrophils Absolute: 3 10*3/uL (ref 1.4–7.0)
Neutrophils: 50 %
PLATELETS: 366 10*3/uL (ref 150–379)
RBC: 4.99 x10E6/uL (ref 4.14–5.80)
RDW: 13.1 % (ref 12.3–15.4)
WBC: 6.1 10*3/uL (ref 3.4–10.8)

## 2016-06-01 LAB — LIPID PANEL W/O CHOL/HDL RATIO
CHOLESTEROL TOTAL: 198 mg/dL (ref 100–199)
HDL: 38 mg/dL — ABNORMAL LOW (ref >39)
LDL Calculated: 122 mg/dL — ABNORMAL HIGH (ref 0–99)
TRIGLYCERIDES: 189 mg/dL — AB (ref 0–149)
VLDL CHOLESTEROL CAL: 38 mg/dL (ref 5–40)

## 2016-06-01 LAB — TSH: TSH: 1.29 u[IU]/mL (ref 0.450–4.500)

## 2016-06-01 LAB — VITAMIN D 25 HYDROXY (VIT D DEFICIENCY, FRACTURES): Vit D, 25-Hydroxy: 26.5 ng/mL — ABNORMAL LOW (ref 30.0–100.0)

## 2016-06-01 NOTE — Progress Notes (Signed)
Normal labs.  Patient notified by letter.

## 2016-11-30 ENCOUNTER — Ambulatory Visit: Payer: BLUE CROSS/BLUE SHIELD | Admitting: Unknown Physician Specialty

## 2016-12-14 ENCOUNTER — Ambulatory Visit: Payer: BLUE CROSS/BLUE SHIELD | Admitting: Unknown Physician Specialty

## 2022-05-13 DIAGNOSIS — R1031 Right lower quadrant pain: Secondary | ICD-10-CM | POA: Diagnosis not present

## 2022-05-13 DIAGNOSIS — N132 Hydronephrosis with renal and ureteral calculous obstruction: Secondary | ICD-10-CM | POA: Diagnosis not present

## 2022-05-13 DIAGNOSIS — R109 Unspecified abdominal pain: Secondary | ICD-10-CM | POA: Diagnosis not present

## 2022-05-13 DIAGNOSIS — E785 Hyperlipidemia, unspecified: Secondary | ICD-10-CM | POA: Diagnosis not present

## 2022-05-21 ENCOUNTER — Ambulatory Visit (INDEPENDENT_AMBULATORY_CARE_PROVIDER_SITE_OTHER): Payer: Self-pay | Admitting: Adult Health

## 2022-05-21 VITALS — BP 130/82 | HR 85 | Temp 98.2°F

## 2022-05-21 DIAGNOSIS — Z0000 Encounter for general adult medical examination without abnormal findings: Secondary | ICD-10-CM | POA: Diagnosis not present

## 2022-05-21 DIAGNOSIS — H1013 Acute atopic conjunctivitis, bilateral: Secondary | ICD-10-CM

## 2022-05-21 DIAGNOSIS — I10 Essential (primary) hypertension: Secondary | ICD-10-CM | POA: Diagnosis not present

## 2022-05-21 DIAGNOSIS — N528 Other male erectile dysfunction: Secondary | ICD-10-CM | POA: Diagnosis not present

## 2022-05-21 DIAGNOSIS — N200 Calculus of kidney: Secondary | ICD-10-CM | POA: Diagnosis not present

## 2022-05-21 NOTE — Progress Notes (Signed)
[facility] [address]  [place], [place] [address] Phone [phone] Fax  [phone]  Worker's Compensation Report Form   Edward Wong Date of Birth:11/17/1985 Phone Number:[phone] Email:[email or web address] Department:Facilities Job Title:Grounds Keeper Supervisor:Edward Wong Supervisor Notified:Yes  Date of Injury:05/20/22 Time of Injury:9am Shift Worked:630am-3pm Location where injury occurred (address or landmark):[address], across from the [place]  Body Part Injured:Eyes  Vital Signs BP 130/82 (BP Location: Left Arm)   Pulse 85   Temp 98.2 F (36.8 C) (Tympanic)   SpO2 98%   Injury Description  He typically mows grass, but yesterday he started noticing his eyes were itching and swelling.  They became red.  This morning his eyes were crusted shut. He started using Padaday drops last night.    Provider Note Pale Nares, Injected sclarea, conjunctiva mildly inflamed.    Diagnosis  1. Allergic conjunctivitis of both eyes Use Padaday drops as discussed.  Get Lubricating eye drop as needed. Cool compress to eyes if swelling is prominent.  Follow up as needed.  Discussed using Daily allergy medication such as Zyrtec or Claritin. Also instructed patient to use Flonase, Two sprays in each nostril twice daily.      Medications Prescribed  No orders of the defined types were placed in this encounter.   Referred to: No Referral at this time.     Return to Work Status May return to work 05/22/22 Follow up visit in clinic on 05/24/22   Provider Signature ________________________________________Date_________   Employee Signature _______________________________________Date_________   Please email this completed form to Edward Wong, Director of Risk Management at [email or web address] within 24 hours of visit.

## 2022-05-24 ENCOUNTER — Ambulatory Visit (INDEPENDENT_AMBULATORY_CARE_PROVIDER_SITE_OTHER): Payer: BC Managed Care – PPO | Admitting: Physician Assistant

## 2022-05-24 ENCOUNTER — Ambulatory Visit: Payer: BC Managed Care – PPO | Admitting: Physician Assistant

## 2022-05-24 VITALS — BP 132/90 | HR 75 | Temp 97.4°F | Resp 16 | Wt 237.0 lb

## 2022-05-24 DIAGNOSIS — H1013 Acute atopic conjunctivitis, bilateral: Secondary | ICD-10-CM

## 2022-05-24 NOTE — Progress Notes (Signed)
[facility] 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 They became red. This morning his eyes were crusted shut. He started using Padaday drops last night    Provider Note ENT: +pale and slightly swollen inferior turbinates.  Eyes: Normal conjunctiva, EOM - FROM, Pupils equally reactive to light. No FB visualized.  VA without correction: OD: 20/30; OS: 20/30; OU: 20/30  A/P: Continue with eye drops, nasal spray, and oral anti-histamine as previously instructed. RTC if needed.  Diagnosis  Allergic Conjunctivitis of both eyes H10.13  Medications Prescribed  No orders of the defined types were placed in this encounter.   Referred to none    Return to Work Status Full duty Released from care and discharged from the clinic.    Provider Signature ________________________________________Date_________   Employee Signature _______________________________________Date_________   Please email this completed form to Edward Wong, Director of Risk Management at [email or web address] within 24 hours of visit.

## 2022-07-16 DIAGNOSIS — L308 Other specified dermatitis: Secondary | ICD-10-CM | POA: Diagnosis not present

## 2022-09-06 DIAGNOSIS — M545 Low back pain, unspecified: Secondary | ICD-10-CM | POA: Diagnosis not present

## 2022-12-24 DIAGNOSIS — R0789 Other chest pain: Secondary | ICD-10-CM | POA: Diagnosis not present

## 2023-03-31 DIAGNOSIS — N528 Other male erectile dysfunction: Secondary | ICD-10-CM | POA: Diagnosis not present

## 2023-03-31 DIAGNOSIS — R7989 Other specified abnormal findings of blood chemistry: Secondary | ICD-10-CM | POA: Diagnosis not present

## 2023-03-31 DIAGNOSIS — N200 Calculus of kidney: Secondary | ICD-10-CM | POA: Diagnosis not present

## 2023-03-31 DIAGNOSIS — N529 Male erectile dysfunction, unspecified: Secondary | ICD-10-CM | POA: Diagnosis not present

## 2023-03-31 DIAGNOSIS — R5383 Other fatigue: Secondary | ICD-10-CM | POA: Diagnosis not present

## 2023-04-04 DIAGNOSIS — N200 Calculus of kidney: Secondary | ICD-10-CM | POA: Diagnosis not present

## 2023-04-04 DIAGNOSIS — R7989 Other specified abnormal findings of blood chemistry: Secondary | ICD-10-CM | POA: Diagnosis not present

## 2023-04-04 DIAGNOSIS — R5383 Other fatigue: Secondary | ICD-10-CM | POA: Diagnosis not present

## 2023-05-20 ENCOUNTER — Encounter: Payer: Self-pay | Admitting: Adult Health

## 2023-05-20 ENCOUNTER — Ambulatory Visit: Payer: Self-pay | Admitting: Adult Health

## 2023-05-20 ENCOUNTER — Other Ambulatory Visit: Payer: Self-pay

## 2023-05-20 VITALS — BP 145/105 | HR 83 | Temp 98.7°F | Ht 70.0 in | Wt 245.0 lb

## 2023-05-20 DIAGNOSIS — J0110 Acute frontal sinusitis, unspecified: Secondary | ICD-10-CM

## 2023-05-20 MED ORDER — AMOXICILLIN-POT CLAVULANATE 875-125 MG PO TABS: 1.0000 | ORAL_TABLET | Freq: Two times a day (BID) | ORAL | 0 refills | Status: DC

## 2023-05-20 NOTE — Progress Notes (Signed)
 Therapist, [facility] [address] [place], [place] [address]   Office Visit Note  Patient Name: Edward Wong Date of Birth [id number]  Medical Record number [id number]  Date of Service: 05/20/2023  Chief Complaint  Patient presents with   Sinus Problem    Patient c/o sinus pressure and nasal congestion x 2 weeks. He has been taking Mucinex/sinus medication, allergy medication, and saline nasal spray but symptoms are still persisting.     Sinus Problem Associated symptoms include congestion and sinus pressure. Pertinent negatives include no chills or coughing.   Pt is here for a sick visit. He reports 2 weeks of sinus symptoms.  He reports sinus pressure, congestion. Denies sore throat, headache, fever or chills.  He has taken mucinex, sinus meds, and a nasal spray with minimal improvement. He does take allergy meds regularly as well.  Pt BP high today, has not taken his hydrochlorothiazide today yet.    Current Medication:  Outpatient Encounter Medications as of 05/20/2023  Medication Sig   amoxicillin -clavulanate (AUGMENTIN ) 875-125 MG tablet Take 1 tablet by mouth 2 (two) times daily.   hydrochlorothiazide (HYDRODIURIL) 12.5 MG tablet Take 12.5 mg by mouth daily.   sildenafil  (REVATIO ) 20 MG tablet Take 1-5 tablets by mouth daily as needed.   No facility-administered encounter medications on file as of 05/20/2023.      Medical History: Past Medical History:  Diagnosis Date   Asthma    Kidney stones    Obesity      Vital Signs: BP (!) 145/105 Comment: Has not taken HCTZ yet today  Pulse 83   Temp 98.7 F (37.1 C)   Ht 5' 10 (1.778 m)   Wt 245 lb (111.1 kg)   BMI 35.15 kg/m    Review of Systems  Constitutional:  Negative for chills, fatigue and fever.  HENT:  Positive for congestion, sinus pressure and sinus pain.   Eyes:  Negative for pain and itching.  Respiratory:  Negative for cough.   Cardiovascular:  Negative for chest pain.    Physical  Exam Vitals reviewed.  Constitutional:      Appearance: Normal appearance.  HENT:     Head: Normocephalic.     Right Ear: Tympanic membrane and ear canal normal.     Left Ear: Tympanic membrane and ear canal normal.     Nose:     Right Sinus: Maxillary sinus tenderness and frontal sinus tenderness present.     Left Sinus: Maxillary sinus tenderness present. No frontal sinus tenderness.     Mouth/Throat:     Mouth: Mucous membranes are moist.  Eyes:     Pupils: Pupils are equal, round, and reactive to light.  Pulmonary:     Effort: Pulmonary effort is normal.  Lymphadenopathy:     Cervical: No cervical adenopathy.  Neurological:     Mental Status: He is alert.     Assessment/Plan: 1. Acute non-recurrent frontal sinusitis (Primary) Patient Instructions: -Take complete course of antibiotics as prescribed.  Take with food.  -Try Flonase/Fluticasone nasal spray, 2 sprays to each nostril once a day. -You can try using a neti pot or nasal saline rinse product to help clear mucus congestion. -Rest and stay well hydrated (by drinking water and other liquids). Avoid/limit caffeine. -Take over-the-counter medicines (i.e. Mucinex, decongestant, Ibuprofen or Tylenol, cough suppressant) to help relieve your symptoms. -For your cough, use cough drops/throat lozenges, gargle warm salt water and/or drink warm liquids (like tea with honey). -Send my chart message  to provider or schedule return visit as needed for new/worsening symptoms or if symptoms do not improve as discussed with antibiotic and other recommended treatment.   - amoxicillin -clavulanate (AUGMENTIN ) 875-125 MG tablet; Take 1 tablet by mouth 2 (two) times daily.  Dispense: 20 tablet; Refill: 0           General Counseling: Edward Wong verbalizes understanding of the findings of todays visit and agrees with plan of treatment. I have discussed any further diagnostic evaluation that may be needed or ordered today. We also reviewed his  medications today. he has been encouraged to call the office with any questions or concerns that should arise related to todays visit.   No orders of the defined types were placed in this encounter.   Meds ordered this encounter  Medications   amoxicillin -clavulanate (AUGMENTIN ) 875-125 MG tablet    Sig: Take 1 tablet by mouth 2 (two) times daily.    Dispense:  20 tablet    Refill:  0    Time spent:15 Minutes    Edward Wong AGNP-C Nurse Practitioner

## 2023-07-15 DIAGNOSIS — N528 Other male erectile dysfunction: Secondary | ICD-10-CM | POA: Diagnosis not present

## 2023-07-15 DIAGNOSIS — I10 Essential (primary) hypertension: Secondary | ICD-10-CM | POA: Diagnosis not present

## 2023-07-15 DIAGNOSIS — G4733 Obstructive sleep apnea (adult) (pediatric): Secondary | ICD-10-CM | POA: Diagnosis not present

## 2023-07-15 DIAGNOSIS — R5383 Other fatigue: Secondary | ICD-10-CM | POA: Diagnosis not present

## 2023-07-15 DIAGNOSIS — E291 Testicular hypofunction: Secondary | ICD-10-CM | POA: Diagnosis not present

## 2023-10-20 ENCOUNTER — Encounter: Payer: Self-pay | Admitting: Adult Health

## 2023-10-20 ENCOUNTER — Other Ambulatory Visit: Payer: Self-pay

## 2023-10-20 ENCOUNTER — Ambulatory Visit: Payer: Self-pay | Admitting: Adult Health

## 2023-10-20 VITALS — BP 140/90 | HR 86 | Temp 97.7°F | Ht 70.0 in | Wt 240.0 lb

## 2023-10-20 DIAGNOSIS — M545 Low back pain, unspecified: Secondary | ICD-10-CM

## 2023-10-20 MED ORDER — CYCLOBENZAPRINE HCL 10 MG PO TABS: 10.0000 mg | ORAL_TABLET | Freq: Every day | ORAL | 0 refills | Status: AC

## 2023-10-20 NOTE — Progress Notes (Signed)
 [facility] [address] [place], [place] [address]   Office Visit Note  Patient Name: Edward Wong Date of Birth [id number]  Medical Record number [id number]  Date of Service: 10/20/2023  Chief Complaint  Patient presents with   Back Pain    Pain reports tightness in his lower back since yesterday afternoon. No known injury. He took Tylenol and Advil to help ease the pain.     HPI Pt is here for a sick visit. He reports yesterday mid day, he started having some low back pain.  He describes it as tightness, and he doesn't have an deficit in his movement or lifting.  He took advil back/body with some relief. He also did a medicated patch, and he was able to sleep.    Current Medication:  Outpatient Encounter Medications as of 10/20/2023  Medication Sig   cyclobenzaprine  (FLEXERIL ) 10 MG tablet Take 1 tablet (10 mg total) by mouth at bedtime.   hydrochlorothiazide (HYDRODIURIL) 12.5 MG tablet Take 12.5 mg by mouth daily.   sildenafil  (REVATIO ) 20 MG tablet Take 1-5 tablets by mouth daily as needed.   [DISCONTINUED] amoxicillin -clavulanate (AUGMENTIN ) 875-125 MG tablet Take 1 tablet by mouth 2 (two) times daily.   No facility-administered encounter medications on file as of 10/20/2023.      Medical History: Past Medical History:  Diagnosis Date   Asthma    Kidney stones    Obesity      Vital Signs: BP (!) 140/90   Pulse 86   Temp 97.7 F (36.5 C)   Ht 5' 10 (1.778 m)   Wt 240 lb (108.9 kg)   SpO2 98%   BMI 34.44 kg/m    Review of Systems  Constitutional:  Negative for chills, fatigue and fever.  Genitourinary:  Negative for difficulty urinating, dysuria, frequency and hematuria.  Musculoskeletal:  Positive for back pain.    Physical Exam Vitals reviewed.  Constitutional:      Appearance: Normal appearance.  Musculoskeletal:     Comments: Lower left back pain.  Mild tenderness with palpation.  Neurological:     Mental Status: He is alert.     Assessment/Plan: 1. Acute left-sided low back pain without sciatica (Primary) Take flexeril  at night for a few days.  Follow up via MyChart messenger if symptoms fail to improve or may return to clinic as needed for worsening symptoms.   - cyclobenzaprine  (FLEXERIL ) 10 MG tablet; Take 1 tablet (10 mg total) by mouth at bedtime.  Dispense: 10 tablet; Refill: 0     General Counseling: Edward Wong verbalizes understanding of the findings of todays visit and agrees with plan of treatment. I have discussed any further diagnostic evaluation that may be needed or ordered today. We also reviewed his medications today. he has been encouraged to call the office with any questions or concerns that should arise related to todays visit.   No orders of the defined types were placed in this encounter.   Meds ordered this encounter  Medications   cyclobenzaprine  (FLEXERIL ) 10 MG tablet    Sig: Take 1 tablet (10 mg total) by mouth at bedtime.    Dispense:  10 tablet    Refill:  0    Time spent:15 Minutes    Edward Wong AGNP-C Nurse Practitioner
